# Patient Record
Sex: Female | Born: 1955 | Race: White | Hispanic: No | Smoking: Never smoker
Health system: Southern US, Community
[De-identification: ages and names within clinical notes are randomized; demographics above are authoritative.]

## PROBLEM LIST (undated history)

## (undated) HISTORY — PX: CHOLECYSTECTOMY: SHX55

---

## 2018-04-16 ENCOUNTER — Emergency Department
Admission: EM | Admit: 2018-04-16 | Discharge: 2018-04-17 | Disposition: A | Discharge status: Home / Self Care | Payer: Self-pay | Attending: Emergency Medicine | Admitting: Emergency Medicine

## 2018-04-16 ENCOUNTER — Emergency Department: Payer: Self-pay

## 2018-04-16 ENCOUNTER — Encounter: Payer: Self-pay | Admitting: Emergency Medicine

## 2018-04-16 DIAGNOSIS — Z23 Encounter for immunization: Secondary | ICD-10-CM | POA: Insufficient documentation

## 2018-04-16 DIAGNOSIS — W25XXXA Contact with sharp glass, initial encounter: Secondary | ICD-10-CM | POA: Insufficient documentation

## 2018-04-16 DIAGNOSIS — Y929 Unspecified place or not applicable: Secondary | ICD-10-CM | POA: Insufficient documentation

## 2018-04-16 DIAGNOSIS — Y999 Unspecified external cause status: Secondary | ICD-10-CM | POA: Insufficient documentation

## 2018-04-16 DIAGNOSIS — S99922A Unspecified injury of left foot, initial encounter: Secondary | ICD-10-CM

## 2018-04-16 DIAGNOSIS — Y9389 Activity, other specified: Secondary | ICD-10-CM | POA: Insufficient documentation

## 2018-04-16 DIAGNOSIS — S99822A Other specified injuries of left foot, initial encounter: Secondary | ICD-10-CM | POA: Insufficient documentation

## 2018-04-16 MED ORDER — TETANUS-DIPHTH-ACELL PERTUSSIS 5-2.5-18.5 LF-MCG/0.5 IM SUSP
0.5000 mL | Freq: Once | INTRAMUSCULAR | Status: AC
  Administered 2018-04-16: 0.5 mL via INTRAMUSCULAR
  Filled 2018-04-16: qty 0.5

## 2018-04-16 MED ORDER — CLINDAMYCIN HCL 300 MG PO CAPS: 300.0000 mg | ORAL_CAPSULE | Freq: Three times a day (TID) | ORAL | 0 refills | Status: AC

## 2018-04-16 MED ORDER — CLINDAMYCIN HCL 150 MG PO CAPS
300.0000 mg | ORAL_CAPSULE | Freq: Once | ORAL | Status: AC
  Administered 2018-04-17: 300 mg via ORAL
  Filled 2018-04-16: qty 2

## 2018-04-16 NOTE — ED Triage Notes (Signed)
Pt reports she stepped on glass and has not been able to stop bleeding nor get glass out. Bleeding controled and new bandage applied.

## 2018-04-16 NOTE — ED Provider Notes (Signed)
[facility] Emergency Department Provider Note  ____________________________________________  Time seen: Approximately 10:44 PM  I have reviewed the triage vital signs and the nursing notes.   HISTORY  Chief Complaint Laceration    HPI Pamela Gross is a 62 y.o. female presents to emergency department for evaluation after stepping on a piece of glass tonight.  Patient is unsure if there is still glass in her foot.  She states that her daughter tried to pull the glass out but foot Bleeding.  She states that EMS saw a small piece of glass on the gauze when they were dressing her foot but is unsure if there is more in her foot.  Foot is painful.  Tetanus shot is out of date.   History reviewed. No pertinent past medical history.  There are no active problems to display for this patient.   History reviewed. No pertinent surgical history.  Prior to Admission medications   Medication Sig Start Date End Date Taking? Authorizing Provider  clindamycin (CLEOCIN) 300 MG capsule Take 1 capsule (300 mg total) by mouth 3 (three) times daily for 10 days. 04/16/18 04/26/18  Enid DerryWagner, Kimberlie Csaszar, PA-C    Allergies Morphine and related and Penicillins  History reviewed. No pertinent family history.  Social History Social History   Tobacco Use  . Smoking status: Never Smoker  . Smokeless tobacco: Never Used  Substance Use Topics  . Alcohol use: Not on file  . Drug use: Not on file     Review of Systems  Constitutional: No fever/chills Gastrointestinal:  No nausea, no vomiting.  Musculoskeletal: Positive for foot pain Skin: Negative for rash, ecchymosis.  Positive for laceration. Neurological: Negative for numbness or tingling   ____________________________________________   PHYSICAL EXAM:  VITAL SIGNS: ED Triage Vitals [04/16/18 2121]  Enc Vitals Group     BP (!) 166/95     Pulse Rate 74     Resp 20     Temp 98 F (36.7 C)     Temp Source Oral     SpO2  97 %     Weight 280 lb (127 kg)     Height 5\' 8"  (1.727 m)     Head Circumference      Peak Flow      Pain Score      Pain Loc      Pain Edu?      Excl. in GC?      Constitutional: Alert and oriented. Well appearing and in no acute distress. Eyes: Conjunctivae are normal. PERRL. EOMI. Head: Atraumatic. ENT:      Ears:      Nose: No congestion/rhinnorhea.      Mouth/Throat: Mucous membranes are moist.  Neck: No stridor.   Cardiovascular: Normal rate, regular rhythm.  Good peripheral circulation. Respiratory: Normal respiratory effort without tachypnea or retractions. Lungs CTAB. Good air entry to the bases with no decreased or absent breath sounds. Musculoskeletal: Full range of motion to all extremities. No gross deformities appreciated. Neurologic:  Normal speech and language. No gross focal neurologic deficits are appreciated.  Skin:  Skin is warm, dry.  1 mm laceration to distal lateral plantar foot.  No active bleeding.  No palpable foreign body. Psychiatric: Mood and affect are normal. Speech and behavior are normal. Patient exhibits appropriate insight and judgement.   ____________________________________________   LABS (all labs ordered are listed, but only abnormal results are displayed)  Labs Reviewed - No data to display ____________________________________________  EKG   ____________________________________________  RADIOLOGY I, Enid Derryshley Kadance Mccuistion, personally viewed and evaluated these images (plain radiographs) as part of my medical decision making, as well as reviewing the written report by the radiologist.  Dg Foot Complete Left  Result Date: 04/16/2018 CLINICAL DATA:  Stepped on glass with left foot. Assess for foreign body. EXAM: LEFT FOOT - COMPLETE 3+ VIEW COMPARISON:  None. FINDINGS: There is no evidence of fracture or dislocation. The joint spaces are preserved. There is no evidence of talar subluxation; the subtalar joint is unremarkable in appearance. A  small plantar calcaneal spur is noted. Diffuse soft tissue swelling is noted about the ankle and foot. No radiopaque foreign bodies are seen. IMPRESSION: No evidence of fracture or dislocation. No radiopaque foreign bodies seen. Electronically Signed   By: Roanna RaiderJeffery  Chang M.D.   On: 04/16/2018 21:49    ____________________________________________    PROCEDURES  Procedure(s) performed:    Procedures    Medications  clindamycin (CLEOCIN) capsule 300 mg (has no administration in time range)  Tdap (BOOSTRIX) injection 0.5 mL (0.5 mLs Intramuscular Given 04/16/18 2249)     ____________________________________________   INITIAL IMPRESSION / ASSESSMENT AND PLAN / ED COURSE  Pertinent labs & imaging results that were available during my care of the patient were reviewed by me and considered in my medical decision making (see chart for details).  Review of the [place] CSRS was performed in accordance of the [facility] prior to dispensing any controlled drugs.     Patient presented to the emergency department for evaluation of possible foreign body in foot.  Vital signs and exam are reassuring.  X-ray negative for acute foreign body.  Patient thinks that foreign body may have come out during bleeding, as she no longer feels the sharp stabbing pain and just a dull ache.  I do not currently palpate any foreign body.  Ultrasound to further evaluate for possible foreign body was offered and patient declines and prefers to follow-up outpatient.  No signs of infection.  She will call podiatry in the morning for an appointment.  Patient was educated to keep wound covered and to use crutches to prevent infection, as she has been walking around barefoot.  Tetanus shot is updated.  Patient will be discharged home with prescriptions for clindamycin. Patient is to follow up with podiatry as directed. Patient is given ED precautions to return to the ED for any worsening or new  symptoms.     ____________________________________________  FINAL CLINICAL IMPRESSION(S) / ED DIAGNOSES  Final diagnoses:  Injury of left foot, initial encounter      NEW MEDICATIONS STARTED DURING THIS VISIT:  ED Discharge Orders         Ordered    clindamycin (CLEOCIN) 300 MG capsule  3 times daily     04/16/18 2334              This chart was dictated using voice recognition software/Dragon. Despite best efforts to proofread, errors can occur which can change the meaning. Any change was purely unintentional.    Enid DerryWagner, Marcquis Ridlon, PA-C 04/17/18 0000    Phineas SemenGoodman, Graydon, MD 04/19/18 [phone]

## 2018-07-12 ENCOUNTER — Other Ambulatory Visit: Payer: Self-pay

## 2018-07-12 ENCOUNTER — Emergency Department
Admission: EM | Admit: 2018-07-12 | Discharge: 2018-07-12 | Disposition: A | Discharge status: Home / Self Care | Payer: Self-pay | Attending: Emergency Medicine | Admitting: Emergency Medicine

## 2018-07-12 DIAGNOSIS — J209 Acute bronchitis, unspecified: Secondary | ICD-10-CM | POA: Insufficient documentation

## 2018-07-12 DIAGNOSIS — R0981 Nasal congestion: Secondary | ICD-10-CM | POA: Insufficient documentation

## 2018-07-12 MED ORDER — AZITHROMYCIN 250 MG PO TABS: ORAL_TABLET | ORAL | 0 refills | Status: AC

## 2018-07-12 MED ORDER — PREDNISONE 10 MG (21) PO TBPK: ORAL_TABLET | ORAL | 0 refills | Status: AC

## 2018-07-12 NOTE — ED Triage Notes (Signed)
Pt arrives via POV from home with c/o SOB, cough, nasal congestion/ runny nose. Pt states symptoms started Monday after being outside on Sunday in cold disinfecting grocery carts. Pt states woke Monday with sore throat and cough, sob. Pt report using albuterol inhaler which improve symptoms, also mucinex cough medication. Pt states unable to lay down to sleep at night due to increase SOB, and cough. NAD at this time

## 2018-07-12 NOTE — ED Provider Notes (Signed)
[facility] Emergency Department Provider Note  ____________________________________________   None    (approximate)  I have reviewed the triage vital signs and the nursing notes.   HISTORY  Chief Complaint Shortness of Breath and Cough    HPI Pamela Gross is a 63 y.o. female presents emergency department complaining of cough and congestion with yellow to green mucus.  Symptoms for 4 days.  Denies fever, chills, chest pain or shortness of breath.  Patient states that she has been out of work since Monday as she works at [employer].  states that she started with a cough and congestion which she attributed to her asthma and allergies.  She was concerned about returning to work with a cough.   History reviewed. No pertinent past medical history.  There are no active problems to display for this patient.   Past Surgical History:  Procedure Laterality Date  . CHOLECYSTECTOMY      Prior to Admission medications   Medication Sig Start Date End Date Taking? Authorizing Provider  azithromycin (ZITHROMAX Z-PAK) 250 MG tablet 2 pills today then 1 pill a day for 4 days 07/12/18   Sherrie Mustache Roselyn Bering, PA-C  predniSONE (STERAPRED UNI-PAK 21 TAB) 10 MG (21) TBPK tablet Take 6 pills on day one then decrease by 1 pill each day 07/12/18   Faythe Ghee, PA-C    Allergies Morphine and related and Penicillins  History reviewed. No pertinent family history.  Social History Social History   Tobacco Use  . Smoking status: Never Smoker  . Smokeless tobacco: Never Used  Substance Use Topics  . Alcohol use: Never    Frequency: Never  . Drug use: Never    Review of Systems  Constitutional: No fever/chills Eyes: No visual changes. ENT: No sore throat. Respiratory: Positive cough and congestion, positive wheezing Genitourinary: Negative for dysuria. Musculoskeletal: Negative for back pain. Skin: Negative for rash.     ____________________________________________   PHYSICAL EXAM:  VITAL SIGNS: ED Triage Vitals  Enc Vitals Group     BP 07/12/18 1352 (!) 158/89     Pulse Rate 07/12/18 1352 77     Resp 07/12/18 1352 20     Temp 07/12/18 1352 98.3 F (36.8 C)     Temp Source 07/12/18 1352 Oral     SpO2 07/12/18 1352 96 %     Weight 07/12/18 1353 271 lb (122.9 kg)     Height 07/12/18 1353 5\' 9"  (1.753 m)     Head Circumference --      Peak Flow --      Pain Score 07/12/18 1353 6     Pain Loc --      Pain Edu? --      Excl. in GC? --     Constitutional: Alert and oriented. Well appearing and in no acute distress. Eyes: Conjunctivae are normal.  Head: Atraumatic. ENT:  Nose: No congestion/rhinnorhea. Mouth/Throat: Mucous membranes are moist.   NECK: Is supple, no lymphadenopathy is noted  cardiovascular: Normal rate, regular rhythm.  Heart sounds are normal Respiratory: Normal respiratory effort.  No retractions, lungs with clear to auscultation GU: deferred Musculoskeletal: FROM all extremities, warm and well perfused Neurologic:  Normal speech and language.  Skin:  Skin is warm, dry and intact. No rash noted. Psychiatric: Mood and affect are normal. Speech and behavior are normal.  ____________________________________________   LABS (all labs ordered are listed, but only abnormal results are displayed)  Labs Reviewed - No data  to display ____________________________________________   ____________________________________________  RADIOLOGY    ____________________________________________   PROCEDURES  Procedure(s) performed: No  Procedures    ____________________________________________   INITIAL IMPRESSION / ASSESSMENT AND PLAN / ED COURSE  Pertinent labs & imaging results that were available during my care of the patient were reviewed by me and considered in my medical decision making (see chart for details).   Patient presents emergency department with a 4-day  course of cough and congestion.  Patient has asthma did feel little short of breath prior using her inhaler.  Symptoms resolved with an inhaler.  She is denying any chest pain or shortness of breath at this time.  Vitals are normal and the patient looks nontoxic.  Heart sounds and lung sounds are both normal.  Explained the findings to the patient.  She was given a prescription for Z-Pak and a steroid pack.  She is to follow-up with her regular doctor if not better in 3 days.  Continue use her inhaler.  He was instructed to remain out of work till 7 days from onset of symptoms.  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  She states she understands and will comply.  Patient was discharged stable condition.  He was given information concerning covid and to monitor her temperature etc.     As part of my medical decision making, I reviewed the following data within the electronic MEDICAL RECORD NUMBER Nursing notes reviewed and incorporated, Notes from prior ED visits and [place] Controlled Substance Database  ____________________________________________   FINAL CLINICAL IMPRESSION(S) / ED DIAGNOSES  Final diagnoses:  Acute bronchitis, unspecified organism      NEW MEDICATIONS STARTED DURING THIS VISIT:  Discharge Medication List as of 07/12/2018  2:08 PM    START taking these medications   Details  azithromycin (ZITHROMAX Z-PAK) 250 MG tablet 2 pills today then 1 pill a day for 4 days, Normal    predniSONE (STERAPRED UNI-PAK 21 TAB) 10 MG (21) TBPK tablet Take 6 pills on day one then decrease by 1 pill each day, Normal         Note:  This document was prepared using Dragon voice recognition software and may include unintentional dictation errors.     Faythe Ghee, PA-C 07/12/18 1412    Dionne Bucy, MD 07/12/18 2117

## 2020-05-04 IMAGING — CR DG FOOT COMPLETE 3+V*L*
3 series · 3 of 3 positions shown · non-contrast
Comparison: None.

CLINICAL DATA: Stepped on glass with left foot. Assess for foreign
body.

EXAM:
LEFT FOOT - COMPLETE 3+ VIEW

[foot ap]
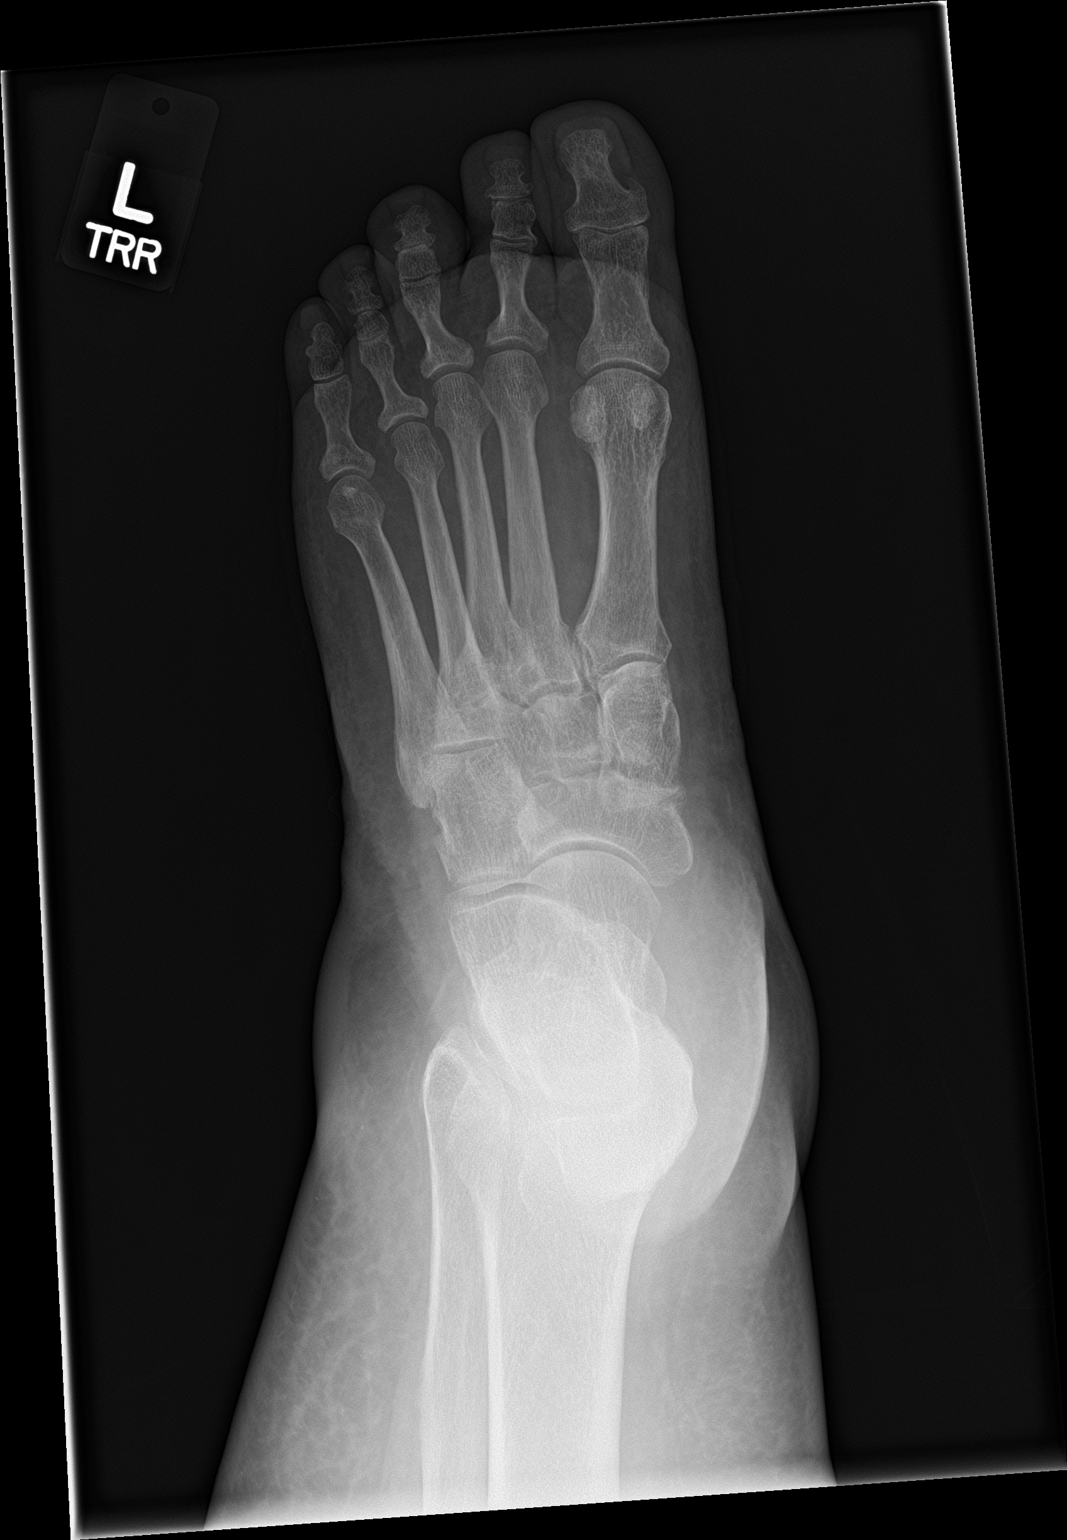

[foot obl]
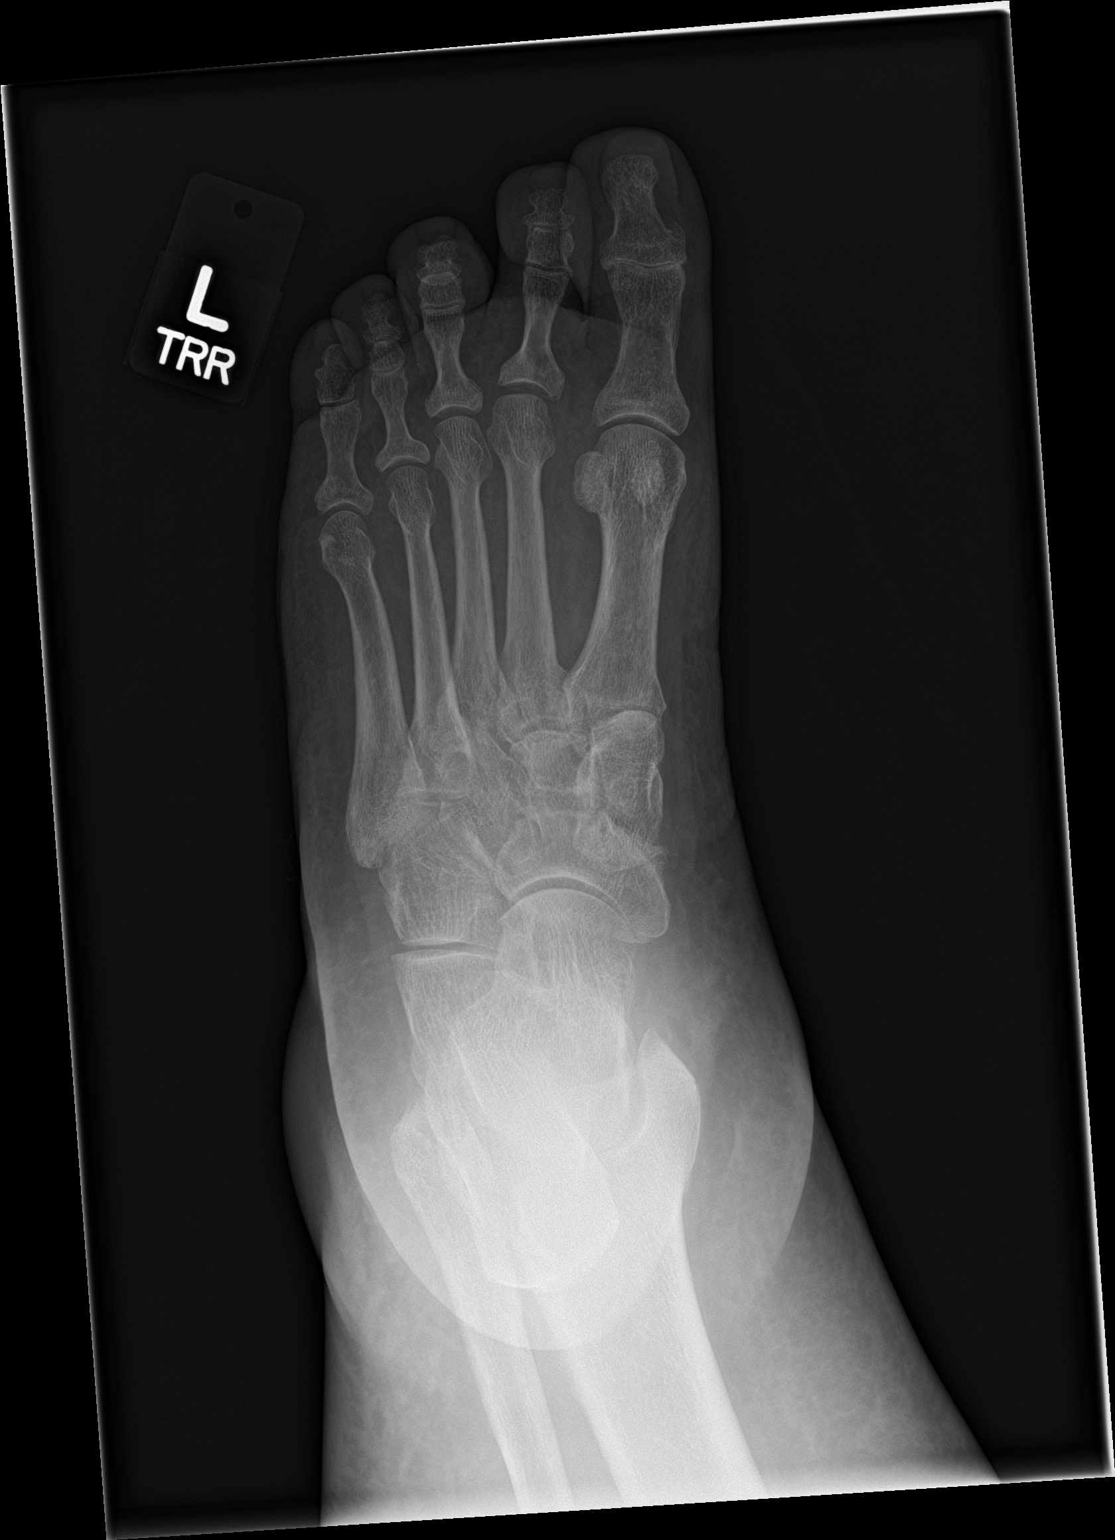

[foot lat]
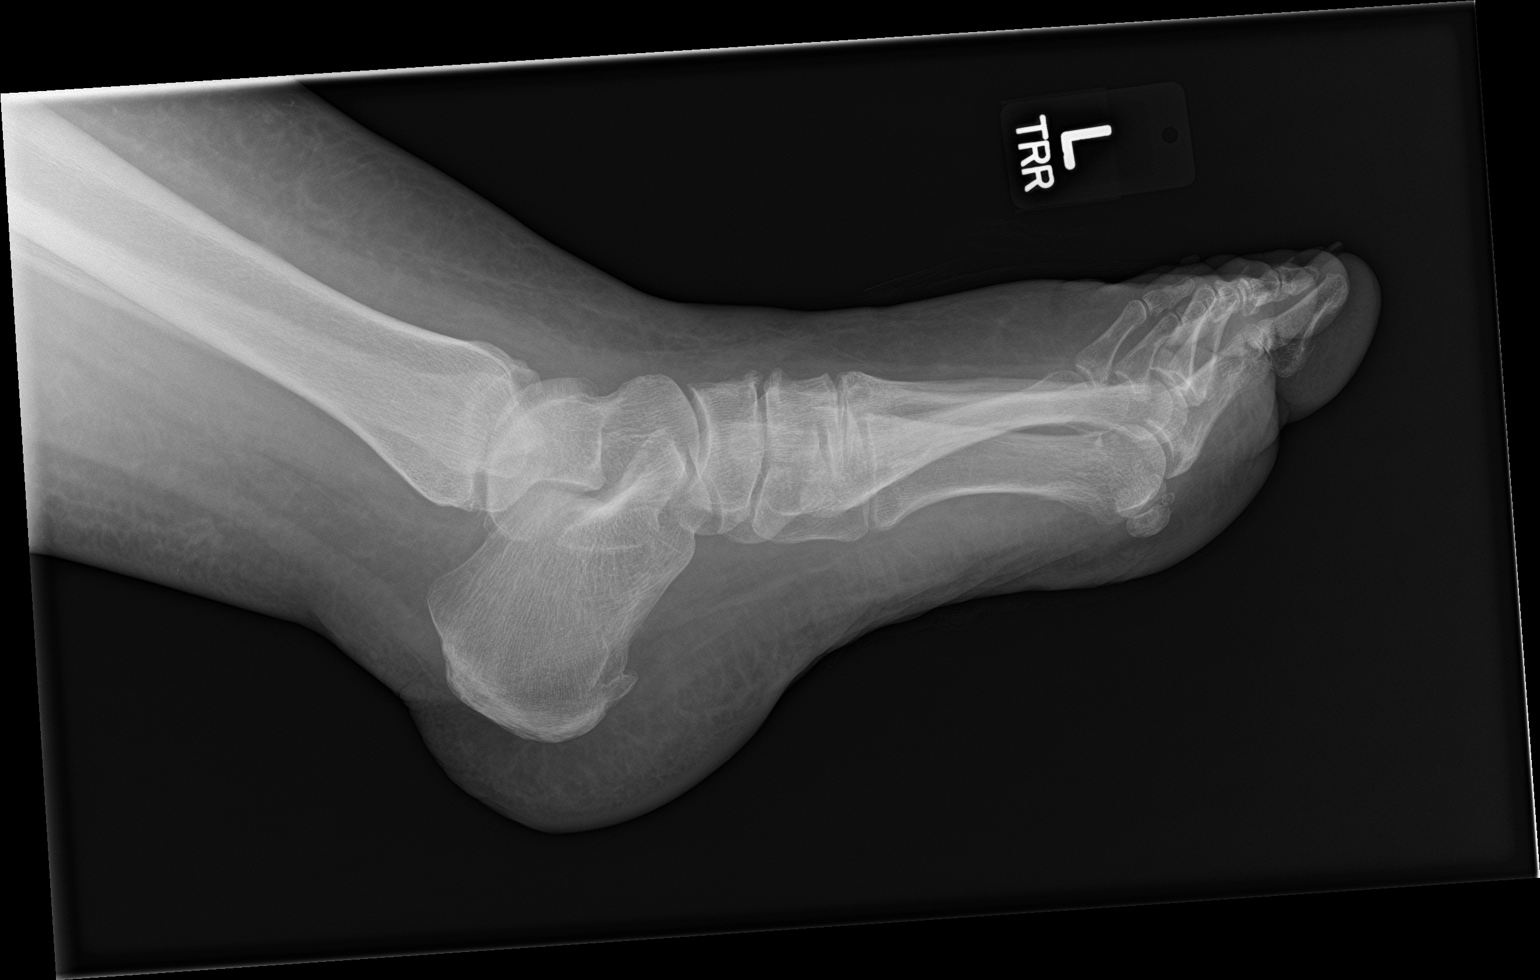

[3 of 3 positions shown; findings below may reference images not displayed]

FINDINGS: There is no evidence of fracture or dislocation. The joint spaces
are preserved. There is no evidence of talar subluxation; the
subtalar joint is unremarkable in appearance. A small plantar
calcaneal spur is noted.

Diffuse soft tissue swelling is noted about the ankle and foot. No
radiopaque foreign bodies are seen.
IMPRESSION: No evidence of fracture or dislocation. No radiopaque foreign bodies
seen.
